# Patient Record
Sex: Male | Born: 1995 | Marital: Single | Smoking: Never smoker
Health system: Southern US, Community
[De-identification: ages and names within clinical notes are randomized; demographics above are authoritative.]

## PROBLEM LIST (undated history)

## (undated) HISTORY — PX: NO PAST SURGERIES: SHX2092

---
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
## 2019-07-20 ENCOUNTER — Ambulatory Visit (INDEPENDENT_AMBULATORY_CARE_PROVIDER_SITE_OTHER): Payer: Self-pay

## 2019-07-20 ENCOUNTER — Ambulatory Visit (HOSPITAL_COMMUNITY): Payer: Self-pay

## 2019-07-20 ENCOUNTER — Other Ambulatory Visit: Payer: Self-pay

## 2019-07-20 ENCOUNTER — Ambulatory Visit (HOSPITAL_COMMUNITY)
Admission: EM | Admit: 2019-07-20 | Discharge: 2019-07-20 | Disposition: A | Discharge status: Home / Self Care | Payer: Self-pay | Attending: Emergency Medicine | Admitting: Emergency Medicine

## 2019-07-20 ENCOUNTER — Encounter (HOSPITAL_COMMUNITY): Payer: Self-pay | Admitting: Emergency Medicine

## 2019-07-20 DIAGNOSIS — S6992XA Unspecified injury of left wrist, hand and finger(s), initial encounter: Secondary | ICD-10-CM

## 2019-07-20 DIAGNOSIS — S62617A Displaced fracture of proximal phalanx of left little finger, initial encounter for closed fracture: Secondary | ICD-10-CM

## 2019-07-20 DIAGNOSIS — M79645 Pain in left finger(s): Secondary | ICD-10-CM

## 2019-07-20 MED ORDER — NAPROXEN 500 MG PO TABS: 500.0000 mg | ORAL_TABLET | Freq: Two times a day (BID) | ORAL | 0 refills | Status: AC

## 2019-07-20 MED ORDER — IBUPROFEN 800 MG PO TABS
ORAL_TABLET | ORAL | Status: AC
  Filled 2019-07-20: qty 1

## 2019-07-20 MED ORDER — HYDROCODONE-ACETAMINOPHEN 5-325 MG PO TABS: 1.0000 | ORAL_TABLET | Freq: Three times a day (TID) | ORAL | 0 refills | Status: DC | PRN

## 2019-07-20 MED ORDER — HYDROCODONE-ACETAMINOPHEN 5-325 MG PO TABS: 2.0000 | ORAL_TABLET | ORAL | 0 refills | Status: DC | PRN

## 2019-07-20 MED ORDER — IBUPROFEN 800 MG PO TABS
800.0000 mg | ORAL_TABLET | Freq: Once | ORAL | Status: AC
  Administered 2019-07-20: 800 mg via ORAL

## 2019-07-20 MED ORDER — NAPROXEN 500 MG PO TABS: 500.0000 mg | ORAL_TABLET | Freq: Two times a day (BID) | ORAL | 0 refills | Status: DC

## 2019-07-20 NOTE — ED Notes (Signed)
Provided ice pack
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
## 2019-07-20 NOTE — Discharge Instructions (Addendum)
Please report to the [facility] emergency room now has your finger fracture is very severe and will likely need a surgical consult.
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
## 2019-07-21 ENCOUNTER — Emergency Department (HOSPITAL_COMMUNITY): Payer: Self-pay

## 2019-07-21 ENCOUNTER — Emergency Department (HOSPITAL_COMMUNITY)
Admission: EM | Admit: 2019-07-21 | Discharge: 2019-07-21 | Disposition: A | Discharge status: Home / Self Care | Payer: Self-pay | Attending: Emergency Medicine | Admitting: Emergency Medicine

## 2019-07-21 DIAGNOSIS — Y999 Unspecified external cause status: Secondary | ICD-10-CM | POA: Insufficient documentation

## 2019-07-21 DIAGNOSIS — Y939 Activity, unspecified: Secondary | ICD-10-CM | POA: Insufficient documentation

## 2019-07-21 DIAGNOSIS — S62617A Displaced fracture of proximal phalanx of left little finger, initial encounter for closed fracture: Secondary | ICD-10-CM | POA: Insufficient documentation

## 2019-07-21 DIAGNOSIS — Y929 Unspecified place or not applicable: Secondary | ICD-10-CM | POA: Insufficient documentation

## 2019-07-21 MED ORDER — IBUPROFEN 400 MG PO TABS
600.0000 mg | ORAL_TABLET | Freq: Once | ORAL | Status: AC
  Administered 2019-07-21: 600 mg via ORAL
  Filled 2019-07-21: qty 1

## 2019-07-21 MED ORDER — BUPIVACAINE HCL (PF) 0.5 % IJ SOLN
10.0000 mL | Freq: Once | INTRAMUSCULAR | Status: AC
  Administered 2019-07-21: 14:00:00 10 mL
  Filled 2019-07-21: qty 10

## 2019-07-21 NOTE — Progress Notes (Signed)
Called regarding left small finger fracture.  The patient was apparently seen in urgent care yesterday, attempts were made to contact the hand surgeon on-call, Dr. Lenon Curt, but never received a call back.  The urgent care referred the patient to the emergency room, but the patient left and never actually went to the emergency room.  He has come back today to [facility] emergency room.  I reviewed his x-rays which demonstrate a significantly displaced proximal phalanx fracture of the small finger, he apparently has a closed injury based on the emergency room evaluation.  I recommended digital block, closed reduction by the emergency room physician, buddy taping, splinting, and referral to Dr. Lenon Curt who has a hand subspecialty practice and was on call for the weekend for definitive management.  I am available to continue participating and contributing to his care if the emergency room closed reduction is not satisfactory, and will review the films when available.   Johnny Bridge, MD
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
## 2019-07-21 NOTE — Discharge Instructions (Signed)
There is evidence of a fracture on the x-ray. Pain:  Antiinflammatory medications: Take 600 mg of ibuprofen every 6 hours or 440 mg (over the counter dose) to 500 mg (prescription dose) of naproxen every 12 hours for the next 3 days. After this time, these medications may be used as needed for pain. Take these medications with food to avoid upset stomach. Choose only one of these medications, do not take them together. Acetaminophen (generic for Tylenol): Should you continue to have additional pain while taking the ibuprofen or naproxen, you may add in acetaminophen as needed. Your daily total maximum amount of acetaminophen from all sources should be limited to 4000mg /day for persons without liver problems, or 2000mg /day for those with liver problems. Ice: May apply ice to the injured area for no more than 15 minutes at a time to reduce swelling and pain. Elevation: Keep the extremity elevated whenever possible to reduce swelling and pain. Splint: Keep the splint clean and dry.  Protect it from water during bathing.  If the splint gets wet, you will need to have it reapplied.  Do not leave a wet splint against the skin as this can cause skin breakdown.  Call the orthopedist office or come to the ED for splint replacement, if needed.  Follow-up: Follow-up with the orthopedic hand specialist for any further management of this issue.  Call the number provided to set up an appointment. Return: Return to the emergency department for severely increased pain, numbness, blanching of the skin, or any other major concerns.
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
## 2019-07-21 NOTE — ED Notes (Signed)
Paged Ortho  

## 2019-07-21 NOTE — ED Notes (Signed)
Provider at bedside

## 2019-07-26 ENCOUNTER — Other Ambulatory Visit: Payer: Self-pay

## 2019-07-26 ENCOUNTER — Encounter (HOSPITAL_BASED_OUTPATIENT_CLINIC_OR_DEPARTMENT_OTHER): Payer: Self-pay | Admitting: *Deleted

## 2019-07-26 ENCOUNTER — Other Ambulatory Visit (HOSPITAL_COMMUNITY)
Admission: RE | Admit: 2019-07-26 | Discharge: 2019-07-26 | Disposition: A | Discharge status: Home / Self Care | Payer: 59 | Source: Ambulatory Visit | Attending: Orthopedic Surgery | Admitting: Orthopedic Surgery

## 2019-07-26 ENCOUNTER — Other Ambulatory Visit: Payer: Self-pay | Admitting: Orthopedic Surgery

## 2019-07-26 DIAGNOSIS — Z01812 Encounter for preprocedural laboratory examination: Secondary | ICD-10-CM | POA: Diagnosis not present

## 2019-07-26 DIAGNOSIS — S62609A Fracture of unspecified phalanx of unspecified finger, initial encounter for closed fracture: Secondary | ICD-10-CM | POA: Diagnosis not present

## 2019-07-26 DIAGNOSIS — Z20828 Contact with and (suspected) exposure to other viral communicable diseases: Secondary | ICD-10-CM | POA: Diagnosis not present

## 2019-07-26 LAB — SARS CORONAVIRUS 2 (TAT 6-24 HRS): SARS Coronavirus 2: NEGATIVE

## 2019-07-29 ENCOUNTER — Ambulatory Visit (HOSPITAL_BASED_OUTPATIENT_CLINIC_OR_DEPARTMENT_OTHER)
Admission: RE | Admit: 2019-07-29 | Discharge: 2019-07-29 | Disposition: A | Discharge status: Home / Self Care | Payer: 59 | Attending: Orthopedic Surgery | Admitting: Orthopedic Surgery

## 2019-07-29 ENCOUNTER — Other Ambulatory Visit: Payer: Self-pay

## 2019-07-29 ENCOUNTER — Ambulatory Visit (HOSPITAL_BASED_OUTPATIENT_CLINIC_OR_DEPARTMENT_OTHER): Payer: 59 | Admitting: Certified Registered"

## 2019-07-29 ENCOUNTER — Encounter (HOSPITAL_BASED_OUTPATIENT_CLINIC_OR_DEPARTMENT_OTHER): Payer: Self-pay | Admitting: Certified Registered"

## 2019-07-29 ENCOUNTER — Ambulatory Visit (HOSPITAL_COMMUNITY): Payer: 59

## 2019-07-29 ENCOUNTER — Encounter (HOSPITAL_BASED_OUTPATIENT_CLINIC_OR_DEPARTMENT_OTHER)
Admission: RE | Disposition: A | Discharge status: Home / Self Care | Payer: Self-pay | Source: Home / Self Care | Attending: Orthopedic Surgery

## 2019-07-29 DIAGNOSIS — Z7982 Long term (current) use of aspirin: Secondary | ICD-10-CM | POA: Diagnosis not present

## 2019-07-29 DIAGNOSIS — Z419 Encounter for procedure for purposes other than remedying health state, unspecified: Secondary | ICD-10-CM

## 2019-07-29 DIAGNOSIS — Z791 Long term (current) use of non-steroidal anti-inflammatories (NSAID): Secondary | ICD-10-CM | POA: Diagnosis not present

## 2019-07-29 DIAGNOSIS — S62617A Displaced fracture of proximal phalanx of left little finger, initial encounter for closed fracture: Secondary | ICD-10-CM | POA: Diagnosis not present

## 2019-07-29 HISTORY — PX: OPEN REDUCTION INTERNAL FIXATION (ORIF) PROXIMAL PHALANX: SHX6235

## 2019-07-29 SURGERY — OPEN REDUCTION INTERNAL FIXATION (ORIF) PROXIMAL PHALANX
Anesthesia: Monitor Anesthesia Care | Site: Finger | Laterality: Left

## 2019-07-29 MED ORDER — DEXAMETHASONE SODIUM PHOSPHATE 10 MG/ML IJ SOLN
INTRAMUSCULAR | Status: AC
  Filled 2019-07-29: qty 2

## 2019-07-29 MED ORDER — OXYCODONE HCL 5 MG PO TABS: 5.0000 mg | ORAL_TABLET | Freq: Four times a day (QID) | ORAL | 0 refills | Status: AC | PRN

## 2019-07-29 MED ORDER — PHENYLEPHRINE 40 MCG/ML (10ML) SYRINGE FOR IV PUSH (FOR BLOOD PRESSURE SUPPORT)
PREFILLED_SYRINGE | INTRAVENOUS | Status: AC
  Filled 2019-07-29: qty 10

## 2019-07-29 MED ORDER — IBUPROFEN 200 MG PO TABS: 600.0000 mg | ORAL_TABLET | Freq: Four times a day (QID) | ORAL | 0 refills | Status: DC

## 2019-07-29 MED ORDER — BUPIVACAINE-EPINEPHRINE 0.5% -1:200000 IJ SOLN
INTRAMUSCULAR | Status: DC | PRN
  Administered 2019-07-29: 10 mL

## 2019-07-29 MED ORDER — CEFAZOLIN SODIUM-DEXTROSE 2-4 GM/100ML-% IV SOLN
INTRAVENOUS | Status: AC
  Filled 2019-07-29: qty 100

## 2019-07-29 MED ORDER — CEFAZOLIN SODIUM-DEXTROSE 2-4 GM/100ML-% IV SOLN
2.0000 g | INTRAVENOUS | Status: AC
  Administered 2019-07-29: 08:00:00 2 g via INTRAVENOUS

## 2019-07-29 MED ORDER — LIDOCAINE 2% (20 MG/ML) 5 ML SYRINGE
INTRAMUSCULAR | Status: AC
  Filled 2019-07-29: qty 15

## 2019-07-29 MED ORDER — FENTANYL CITRATE (PF) 100 MCG/2ML IJ SOLN
50.0000 ug | INTRAMUSCULAR | Status: DC | PRN
  Administered 2019-07-29: 100 ug via INTRAVENOUS

## 2019-07-29 MED ORDER — OXYCODONE HCL 5 MG PO TABS: 5.0000 mg | ORAL_TABLET | Freq: Four times a day (QID) | ORAL | 0 refills | Status: DC | PRN

## 2019-07-29 MED ORDER — ONDANSETRON HCL 4 MG/2ML IJ SOLN: 4.0000 mg | Freq: Once | INTRAMUSCULAR | Status: DC | PRN

## 2019-07-29 MED ORDER — OXYCODONE HCL 5 MG/5ML PO SOLN: 5.0000 mg | Freq: Once | ORAL | Status: DC | PRN

## 2019-07-29 MED ORDER — LIDOCAINE HCL 2 % IJ SOLN
INTRAMUSCULAR | Status: DC | PRN
  Administered 2019-07-29: 10 mL

## 2019-07-29 MED ORDER — POVIDONE-IODINE 10 % EX SWAB: 2.0000 "application " | Freq: Once | CUTANEOUS | Status: DC

## 2019-07-29 MED ORDER — ACETAMINOPHEN 325 MG PO TABS: 650.0000 mg | ORAL_TABLET | Freq: Four times a day (QID) | ORAL | Status: DC

## 2019-07-29 MED ORDER — ONDANSETRON HCL 4 MG/2ML IJ SOLN
INTRAMUSCULAR | Status: AC
  Filled 2019-07-29: qty 12

## 2019-07-29 MED ORDER — LACTATED RINGERS IV SOLN
INTRAVENOUS | Status: DC
  Administered 2019-07-29 (×2): via INTRAVENOUS

## 2019-07-29 MED ORDER — ACETAMINOPHEN 325 MG PO TABS: 650.0000 mg | ORAL_TABLET | Freq: Four times a day (QID) | ORAL | Status: AC

## 2019-07-29 MED ORDER — MIDAZOLAM HCL 2 MG/2ML IJ SOLN
1.0000 mg | INTRAMUSCULAR | Status: DC | PRN
  Administered 2019-07-29: 08:00:00 2 mg via INTRAVENOUS

## 2019-07-29 MED ORDER — OXYCODONE HCL 5 MG PO TABS: 5.0000 mg | ORAL_TABLET | Freq: Once | ORAL | Status: DC | PRN

## 2019-07-29 MED ORDER — PROPOFOL 500 MG/50ML IV EMUL
INTRAVENOUS | Status: AC
  Filled 2019-07-29: qty 150

## 2019-07-29 MED ORDER — FENTANYL CITRATE (PF) 100 MCG/2ML IJ SOLN: 25.0000 ug | INTRAMUSCULAR | Status: DC | PRN

## 2019-07-29 MED ORDER — ONDANSETRON HCL 4 MG/2ML IJ SOLN
INTRAMUSCULAR | Status: DC | PRN
  Administered 2019-07-29: 4 mg via INTRAVENOUS

## 2019-07-29 MED ORDER — PROPOFOL 500 MG/50ML IV EMUL
INTRAVENOUS | Status: DC | PRN
  Administered 2019-07-29: 75 ug/kg/min via INTRAVENOUS

## 2019-07-29 MED ORDER — MIDAZOLAM HCL 2 MG/2ML IJ SOLN
INTRAMUSCULAR | Status: AC
  Filled 2019-07-29: qty 2

## 2019-07-29 MED ORDER — FENTANYL CITRATE (PF) 100 MCG/2ML IJ SOLN
INTRAMUSCULAR | Status: AC
  Filled 2019-07-29: qty 2

## 2019-07-29 MED ORDER — LIDOCAINE HCL (CARDIAC) PF 100 MG/5ML IV SOSY
PREFILLED_SYRINGE | INTRAVENOUS | Status: DC | PRN
  Administered 2019-07-29: 30 mg via INTRAVENOUS

## 2019-07-29 SURGICAL SUPPLY — 60 items
BAND RUBBER #18 3X1/16 STRL (MISCELLANEOUS) ×3 IMPLANT
BIT DRILL 1.1 (BIT) ×1
BIT DRILL 1.1MM (BIT) ×1
BIT DRILL 60X20X1.1XQC TMX (BIT) IMPLANT
BIT DRL 60X20X1.1XQC TMX (BIT) ×1
BLADE MINI RND TIP GREEN BEAV (BLADE) IMPLANT
BLADE SURG 15 STRL LF DISP TIS (BLADE) ×1 IMPLANT
BLADE SURG 15 STRL SS (BLADE) ×2
BNDG COHESIVE 2X5 TAN STRL LF (GAUZE/BANDAGES/DRESSINGS) IMPLANT
BNDG COHESIVE 4X5 TAN STRL (GAUZE/BANDAGES/DRESSINGS) ×3 IMPLANT
BNDG ESMARK 4X9 LF (GAUZE/BANDAGES/DRESSINGS) ×2 IMPLANT
BNDG GAUZE ELAST 4 BULKY (GAUZE/BANDAGES/DRESSINGS) ×3 IMPLANT
CAP PIN PROTECTOR ORTHO WHT (CAP) IMPLANT
CHLORAPREP W/TINT 26 (MISCELLANEOUS) ×3 IMPLANT
CORD BIPOLAR FORCEPS 12FT (ELECTRODE) ×3 IMPLANT
COVER BACK TABLE REUSABLE LG (DRAPES) ×3 IMPLANT
COVER MAYO STAND REUSABLE (DRAPES) ×3 IMPLANT
COVER WAND RF STERILE (DRAPES) IMPLANT
CUFF TOURN SGL QUICK 18X4 (TOURNIQUET CUFF) ×2 IMPLANT
DRAPE C-ARM 42X72 X-RAY (DRAPES) ×3 IMPLANT
DRAPE EXTREMITY T 121X128X90 (DISPOSABLE) ×3 IMPLANT
DRAPE SURG 17X23 STRL (DRAPES) ×3 IMPLANT
DRSG EMULSION OIL 3X3 NADH (GAUZE/BANDAGES/DRESSINGS) ×3 IMPLANT
GAUZE SPONGE 4X4 12PLY STRL LF (GAUZE/BANDAGES/DRESSINGS) ×3 IMPLANT
GLOVE BIO SURGEON STRL SZ7.5 (GLOVE) ×3 IMPLANT
GLOVE BIOGEL PI IND STRL 7.0 (GLOVE) ×1 IMPLANT
GLOVE BIOGEL PI IND STRL 8 (GLOVE) ×1 IMPLANT
GLOVE BIOGEL PI INDICATOR 7.0 (GLOVE) ×6
GLOVE BIOGEL PI INDICATOR 8 (GLOVE) ×2
GLOVE ECLIPSE 6.5 STRL STRAW (GLOVE) ×5 IMPLANT
GOWN STRL REUS W/ TWL LRG LVL3 (GOWN DISPOSABLE) ×2 IMPLANT
GOWN STRL REUS W/TWL LRG LVL3 (GOWN DISPOSABLE) ×4
GOWN STRL REUS W/TWL XL LVL3 (GOWN DISPOSABLE) ×3 IMPLANT
K-WIRE .045X4 (WIRE) IMPLANT
K-WIRE 9  SMOOTH .045 (WIRE) IMPLANT
NDL HYPO 25X1 1.5 SAFETY (NEEDLE) IMPLANT
NEEDLE HYPO 25X1 1.5 SAFETY (NEEDLE) ×3 IMPLANT
NS IRRIG 1000ML POUR BTL (IV SOLUTION) ×3 IMPLANT
PACK BASIN DAY SURGERY FS (CUSTOM PROCEDURE TRAY) ×3 IMPLANT
PADDING CAST ABS 4INX4YD NS (CAST SUPPLIES) ×2
PADDING CAST ABS COTTON 4X4 ST (CAST SUPPLIES) IMPLANT
PLATE T SMALL 1.5MM (Plate) ×2 IMPLANT
SCREW L 1.5X14 (Screw) ×2 IMPLANT
SCREW LOCKING 1.5X10 (Screw) ×4 IMPLANT
SCREW LOCKING 1.5X13MM (Screw) ×4 IMPLANT
SCREW LOCKING 1.5X8 (Screw) ×4 IMPLANT
SPLINT PLASTER CAST XFAST 3X15 (CAST SUPPLIES) IMPLANT
SPLINT PLASTER XTRA FASTSET 3X (CAST SUPPLIES)
STOCKINETTE 6  STRL (DRAPES) ×2
STOCKINETTE 6 STRL (DRAPES) ×1 IMPLANT
SUCTION FRAZIER HANDLE 10FR (MISCELLANEOUS)
SUCTION TUBE FRAZIER 10FR DISP (MISCELLANEOUS) IMPLANT
SUT VICRYL RAPIDE 4-0 (SUTURE) ×3 IMPLANT
SUT VICRYL RAPIDE 4/0 PS 2 (SUTURE) IMPLANT
SYR 10ML LL (SYRINGE) ×2 IMPLANT
SYR BULB 3OZ (MISCELLANEOUS) ×2 IMPLANT
TOWEL GREEN STERILE FF (TOWEL DISPOSABLE) ×3 IMPLANT
TUBE CONNECTING 20'X1/4 (TUBING)
TUBE CONNECTING 20X1/4 (TUBING) IMPLANT
UNDERPAD 30X36 HEAVY ABSORB (UNDERPADS AND DIAPERS) ×3 IMPLANT

## 2019-07-29 NOTE — Op Note (Signed)
07/29/2019  7:38 AM  PATIENT:  Thomas Underwood  23 y.o. male  PRE-OPERATIVE DIAGNOSIS:  L SF P1 fx  POST-OPERATIVE DIAGNOSIS:  Same  PROCEDURE:  ORIF L SF P1 fx  SURGEON: Rayvon Char. Grandville Silos, MD  PHYSICIAN ASSISTANT: Morley Kos, OPA-C  ANESTHESIA:  local and MAC  SPECIMENS:  None  DRAINS:   None  EBL:  less than 50 mL  PREOPERATIVE INDICATIONS:  Thomas Underwood is a  23 y.o. male with displaced angulated left small finger P1 fracture  The risks benefits and alternatives were discussed with the patient preoperatively including but not limited to the risks of infection, bleeding, nerve injury, cardiopulmonary complications, the need for revision surgery, among others, and the patient verbalized understanding and consented to proceed.  OPERATIVE IMPLANTS: Biomet ALPS handset, small T plate and screws  OPERATIVE PROCEDURE:  After receiving prophylactic antibiotics, the patient was escorted to the operative theatre and placed in a supine position.  A surgical "time-out" was performed during which the planned procedure, proposed operative site, and the correct patient identity were compared to the operative consent and agreement confirmed by the circulating nurse according to current facility policy.  Following application of a tourniquet to the operative extremity, I performed a digital block with a mixture of lidocaine Marcaine Baring epinephrine, and the exposed skin was prepped with Chloraprep and draped in the usual sterile fashion.  The limb was exsanguinated with an Esmarch bandage and the tourniquet inflated to approximately 132mHg higher than systolic BP.  Dorsal incision over the fracture phalanx was made sharply with a scalpel, reflecting full-thickness flaps.  The extensor apparatus was split down the midline and reflected.  The fracture was identified, and cleaned of clot debris and reduced.  A small T plate was applied to the dorsal surface, with alignment being  near-anatomic, and with clinically good composite flexion alignment.  Final images were obtained.  The wound was irrigated and the periosteum reapproximated as well as possible with 3-0 Vicryl Rapide suture over the plate, followed by running 4-0 Vicryl closure of the extensor apparatus.  The tourniquet was deflated, additional hemostasis obtained and the wound again irrigated.  The skin was then reapproximated with running 4-0 Vicryl Rapide horizontal mattress sutures.  A short arm splint dressing was applied and he was taken to the recovery room in stable condition.  DISPOSITION: He will be discharged home today, with [facility] a custom protective splint fabricated, and initiation of range of motion exercises.  He will follow-up in my office in 10 to 15 days with new x-rays of the affected digit out of splint.

## 2019-07-29 NOTE — Anesthesia Procedure Notes (Signed)
Procedure Name: MAC Date/Time: 07/29/2019 8:40 AM Performed by: Signe Colt, CRNA Pre-anesthesia Checklist: Patient identified, Emergency Drugs available, Suction available, Patient being monitored and Timeout performed Patient Re-evaluated:Patient Re-evaluated prior to induction Oxygen Delivery Method: Simple face mask

## 2019-07-29 NOTE — Transfer of Care (Signed)
Immediate Anesthesia Transfer of Care Note  Patient: Thomas Underwood  Procedure(s) Performed: OPEN REDUCTION INTERNAL FIXATION (ORIF) LEFT SMALL FINGER FRACTURE (Left Finger)  Patient Location: PACU  Anesthesia Type:MAC  Level of Consciousness: awake, alert , oriented and patient cooperative  Airway & Oxygen Therapy: Patient Spontanous Breathing  Post-op Assessment: Report given to RN and Post -op Vital signs reviewed and stable  Post vital signs: Reviewed and stable  Last Vitals:  Vitals Value Taken Time  BP    Temp    Pulse    Resp    SpO2      Last Pain:  Vitals:   07/29/19 0720  TempSrc: Oral  PainSc: 0-No pain      Patients Stated Pain Goal: 4 (09/09/14 9458)  Complications: No apparent anesthesia complications
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
## 2019-07-29 NOTE — Anesthesia Postprocedure Evaluation (Signed)
Anesthesia Post Note  Patient: Thomas Underwood  Procedure(s) Performed: OPEN REDUCTION INTERNAL FIXATION (ORIF) LEFT SMALL FINGER FRACTURE (Left Finger)     Patient location during evaluation: PACU Anesthesia Type: MAC Level of consciousness: awake and alert Pain management: pain level controlled Vital Signs Assessment: post-procedure vital signs reviewed and stable Respiratory status: spontaneous breathing, nonlabored ventilation and respiratory function stable Cardiovascular status: blood pressure returned to baseline and stable Postop Assessment: no apparent nausea or vomiting Anesthetic complications: no    Last Vitals:  Vitals:   07/29/19 0945 07/29/19 0955  BP: 112/75 107/70  Pulse: (!) 48 (!) 50  Resp: 11 16  Temp:  36.6 C  SpO2: 100% 100%    Last Pain:  Vitals:   07/29/19 0955  TempSrc:   PainSc: 0-No pain                 Lidia Collum

## 2019-07-29 NOTE — Anesthesia Preprocedure Evaluation (Signed)
Anesthesia Evaluation  Patient identified by MRN, date of birth, ID band Patient awake    Reviewed: Allergy & Precautions, NPO status , Patient's Chart, lab work & pertinent test results  History of Anesthesia Complications Negative for: history of anesthetic complications  Airway Mallampati: II  TM Distance: >3 FB Neck ROM: Full    Dental  (+) Teeth Intact   Pulmonary neg pulmonary ROS,    Pulmonary exam normal        Cardiovascular negative cardio ROS Normal cardiovascular exam     Neuro/Psych negative neurological ROS  negative psych ROS   GI/Hepatic negative GI ROS, Neg liver ROS,   Endo/Other  negative endocrine ROS  Renal/GU negative Renal ROS  negative genitourinary   Musculoskeletal negative musculoskeletal ROS (+)   Abdominal   Peds  Hematology negative hematology ROS (+)   Anesthesia Other Findings   Reproductive/Obstetrics                             Anesthesia Physical Anesthesia Plan  ASA: I  Anesthesia Plan: MAC   Post-op Pain Management:    Induction: Intravenous  PONV Risk Score and Plan: Propofol infusion, TIVA and Treatment may vary due to age or medical condition  Airway Management Planned: Natural Airway, Nasal Cannula and Simple Face Mask  Additional Equipment: None  Intra-op Plan:   Post-operative Plan:   Informed Consent: I have reviewed the patients History and Physical, chart, labs and discussed the procedure including the risks, benefits and alternatives for the proposed anesthesia with the patient or authorized representative who has indicated his/her understanding and acceptance.       Plan Discussed with:   Anesthesia Plan Comments:         Anesthesia Quick Evaluation

## 2019-07-29 NOTE — Discharge Instructions (Signed)
Discharge Instructions   You have a dressing with a plaster splint incorporated in it. Move your fingers as much as possible, making a full fist and fully opening the fist. Elevate your hand to reduce pain & swelling of the digits.  Ice over the operative site may be helpful to reduce pain & swelling.  DO NOT USE HEAT. Pain medicine has been prescribed for you.  Take Tylenol 650 mg every 6 hours. Take Naproxen as prescribed. Take Oxycodone 5 mg additionally for severe breakthrough pain. Leave the dressing in place until you return to our office.  You may shower, but keep the bandage clean & dry.  You may drive a car when you are off of prescription pain medications and can safely control your vehicle with both hands. Our office will call you to arrange follow-up   Please call [phone] during normal business hours or [phone] after hours for any problems. Including the following:  - excessive redness of the incisions - drainage for more than 4 days - fever of more than 101.5 F  *Please note that pain medications will not be refilled after hours or on weekends.  WORK STATUS: May return to work on Thursday 08/01/2019. No work with the left hand.   Post Anesthesia Home Care Instructions  Activity: Get plenty of rest for the remainder of the day. A responsible individual must stay with you for 24 hours following the procedure.  For the next 24 hours, DO NOT: -Drive a car -Paediatric nurse -Drink alcoholic beverages -Take any medication unless instructed by your physician -Make any legal decisions or sign important papers.  Meals: Start with liquid foods such as gelatin or soup. Progress to regular foods as tolerated. Avoid greasy, spicy, heavy foods. If nausea and/or vomiting occur, drink only clear liquids until the nausea and/or vomiting subsides. Call your physician if vomiting continues.  Special Instructions/Symptoms: Your throat may feel dry or sore from the  anesthesia or the breathing tube placed in your throat during surgery. If this causes discomfort, gargle with warm salt water. The discomfort should disappear within 24 hours.  If you had a scopolamine patch placed behind your ear for the management of post- operative nausea and/or vomiting:  1. The medication in the patch is effective for 72 hours, after which it should be removed.  Wrap patch in a tissue and discard in the trash. Wash hands thoroughly with soap and water. 2. You may remove the patch earlier than 72 hours if you experience unpleasant side effects which may include dry mouth, dizziness or visual disturbances. 3. Avoid touching the patch. Wash your hands with soap and water after contact with the patch.

## 2019-07-30 ENCOUNTER — Encounter (HOSPITAL_BASED_OUTPATIENT_CLINIC_OR_DEPARTMENT_OTHER): Payer: Self-pay | Admitting: Orthopedic Surgery

## 2019-08-07 ENCOUNTER — Ambulatory Visit: Payer: 59 | Attending: Orthopedic Surgery | Admitting: Occupational Therapy

## 2019-08-20 ENCOUNTER — Ambulatory Visit: Payer: 59 | Attending: Orthopedic Surgery | Admitting: *Deleted

## 2019-08-20 ENCOUNTER — Other Ambulatory Visit: Payer: Self-pay

## 2019-08-20 ENCOUNTER — Encounter: Payer: Self-pay | Admitting: *Deleted

## 2019-08-20 DIAGNOSIS — M6281 Muscle weakness (generalized): Secondary | ICD-10-CM | POA: Diagnosis present

## 2019-08-20 DIAGNOSIS — R600 Localized edema: Secondary | ICD-10-CM | POA: Insufficient documentation

## 2019-08-20 DIAGNOSIS — R278 Other lack of coordination: Secondary | ICD-10-CM | POA: Diagnosis not present

## 2019-08-20 DIAGNOSIS — M79642 Pain in left hand: Secondary | ICD-10-CM | POA: Insufficient documentation

## 2019-08-20 DIAGNOSIS — M25542 Pain in joints of left hand: Secondary | ICD-10-CM | POA: Diagnosis present

## 2019-08-20 DIAGNOSIS — M25642 Stiffness of left hand, not elsewhere classified: Secondary | ICD-10-CM | POA: Insufficient documentation

## 2019-08-20 DIAGNOSIS — M2560 Stiffness of unspecified joint, not elsewhere classified: Secondary | ICD-10-CM

## 2019-08-20 NOTE — Patient Instructions (Addendum)
Flexor Tendon Gliding (Active Full Fist)    Straighten all fingers, then make a fist, bending all joints. Repeat _10__ times. Do _6-8_ sessions per day.  AROM: Finger Flexion / Extension    Actively bend fingers of left hand. Start with knuckles furthest from palm, and slowly make a fist. Hold _3-5 seconds. Relax. Then straighten fingers as far as possible. Repeat _5-10___ times per set. Do __6-8__ sessions per day.    Do finger straightening at edge of table and hold for 3-5 seconds each time for stretch. Repeat 5-10 times 6-8 times per day. Or place hand on table and lift fingers to straighten, hold in extended position.  Edema Control - keep hand elevated and use ice for swelling as discussed in clinic.  Splinting use, care, precautions & wear were reviewed extensively in clinic today. Pt will wash splint PRN with cool water and soap or rubbing alcohol. He was able to don/doff in clinic independently and was also able to demonstrate performance of HEP for L small finger as observed in clinic.
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
## 2019-09-09 ENCOUNTER — Other Ambulatory Visit: Payer: Self-pay

## 2019-09-09 ENCOUNTER — Ambulatory Visit: Payer: 59 | Attending: Orthopedic Surgery | Admitting: Occupational Therapy

## 2019-09-09 DIAGNOSIS — M25542 Pain in joints of left hand: Secondary | ICD-10-CM | POA: Diagnosis not present

## 2019-09-09 DIAGNOSIS — M25642 Stiffness of left hand, not elsewhere classified: Secondary | ICD-10-CM | POA: Diagnosis present

## 2019-09-09 DIAGNOSIS — M6281 Muscle weakness (generalized): Secondary | ICD-10-CM

## 2019-09-09 DIAGNOSIS — M2560 Stiffness of unspecified joint, not elsewhere classified: Secondary | ICD-10-CM | POA: Diagnosis present

## 2019-09-09 NOTE — Patient Instructions (Signed)
  DO BELOW FIRST 4 EXERCISES WITH BUDDY STRAP ON:  Flexor Tendon Gliding (Active Hook Fist)   With fingers and knuckles straight, bend middle and tip joints. Do not bend large knuckles. Repeat _10-15___ times. Do 6-8___ sessions per day.  MP Flexion (Active)   With back of hand on table, bend large knuckles as far as they will go, keeping small joints straight. Repeat _10-15___ times. Do __6-8__ sessions per day. Activity: Reach into a narrow container.*      Finger Flexion / Extension   With palm up, bend fingers of left hand toward palm, making a  fist. Straighten fingers, opening fist. Repeat sequence _10-15___ times per session. Do _6-8__ sessions per day. Hand Variation: Palm down   PIP Extension (Active Controlled With Wrist and MP Flexion)    Using other hand to hold BIG KNUCKLES at _70-90___ flexion, straighten middle joints of fingers. Repeat _10-15___ times. Do _6-8___ sessions per day.     PIP / DIP Composite Flexion (Passive Stretch)    Use other hand to bend middle and tip joints of __small____ finger, keeping big knuckle straight. Hold __10__ seconds. Repeat __5__ times. Do __6-8__ sessions per day.    PIP Extension (Passive    Use thumb of other hand on top of joint and two fingers under- neath on either side to straighten middle joint of ___SMALL___ finger. Hold __10__ seconds. Repeat _5___ times. Do _6-8___ sessions per day.

## 2019-09-09 NOTE — Therapy (Signed)
[facility] [address] [place], [place], [address] Phone: [phone]   Fax:  [phone]  Occupational Therapy Treatment  Patient Details  Name: Thomas Underwood MRN: [id number] Date of Birth: 1996-08-31 Referring Provider (OT): Dr Milly Jakob   Encounter Date: 09/09/2019  OT End of Session - 09/09/19 1408    Visit Number  2    Number of Visits  12    Date for OT Re-Evaluation  10/01/19    Authorization Type  Pt reports having [employer] insurance through his step-mother, he did not have with him today - see front office notes as pt stated he will bring or fax.    OT Start Time  1318    OT Stop Time  1405    OT Time Calculation (min)  47 min    Activity Tolerance  Patient tolerated treatment well;No increased pain    Behavior During Therapy  [facility] for tasks assessed/performed       No past medical history on file.  Past Surgical History:  Procedure Laterality Date  . NO PAST SURGERIES    . OPEN REDUCTION INTERNAL FIXATION (ORIF) PROXIMAL PHALANX Left 07/29/2019   Procedure: OPEN REDUCTION INTERNAL FIXATION (ORIF) LEFT SMALL FINGER FRACTURE;  Surgeon: Milly Jakob, MD;  Location: [facility];  Service: Orthopedics;  Laterality: Left;  FINISH OFF WITH LOCAL    There were no vitals filed for this visit.  Subjective Assessment - 09/09/19 1322    Pertinent History  ORIF Lt small finger proximal phalanx 07/29/19    Limitations  no strengthening    Patient Stated Goals  Pt wants to get use of left hand hand and small finger for daily activity    Currently in Pain?  Yes    Pain Score  7     Pain Location  --   small finger   Pain Orientation  Left    Pain Descriptors / Indicators  Aching    Pain Type  Acute pain    Pain Onset  More than a month ago    Pain Frequency  Intermittent    Aggravating Factors   with P/ROM    Pain Relieving Factors  Rest       Pt arrived with splint on. When splint  removed, pt demo decreased ability to actively extend PIP joint of Lt small finger even though therapist could passively achieve almost full extension. Pt also demo decreased active and passive flexion at PIP joint. Lt small finger: MP flex = 70*, ext = 0*;  PIP flex = 70*, ext = -55* Updated HEP and issued buddy straps for first 4 exercises - see pt instructions for details. Pt demo each x 5 reps. Pt instructed in how to properly don buddy straps for greatest effectiveness.  Adjusted splint for full PIP extension and reviewed wear and care.   Pt does not currently have f/u appointment with hand surgeon. Pt encouraged to make an appointment ASAP                     OT Education - 09/09/19 1345    Education Details  Updated HEP    Person(s) Educated  Patient    Methods  Explanation;Demonstration;Verbal cues;Handout    Comprehension  Verbalized understanding;Returned demonstration;Verbal cues required       OT Short Term Goals - 09/09/19 1409      OT SHORT TERM GOAL #1   Title  Pt will be I splint  use, care and precautions L hand/small finger.    Baseline  verbal cues required    Time  3    Period  Weeks    Status  Achieved    Target Date  09/10/19      OT SHORT TERM GOAL #2   Title  Pt will be Mod I HEP L small finger    Baseline  Req Min VC's    Time  3    Period  Weeks    Status  Achieved    Target Date  09/10/19        OT Long Term Goals - 08/20/19 1049      OT LONG TERM GOAL #1   Title  Pt wil lbe Mod I upgraded HEP L small finger    Baseline  dependent    Time  6    Period  Weeks    Status  New    Target Date  10/01/19      OT LONG TERM GOAL #2   Title  Pt wil lbe Mod I edema control techniques L hand.small finger    Baseline  Req Min VC's    Time  6    Period  Weeks    Status  New    Target Date  10/01/19      OT LONG TERM GOAL #3   Title  Pt will demonstrate increased functional use L hand as seen by ability to make functional full fist  during simulated ADL activity.    Baseline  dependent/unable    Time  6    Period  Weeks    Status  New    Target Date  10/01/19      OT LONG TERM GOAL #4   Title  Pt will be Mod I scar management techniques L dorsal small finger.    Baseline  Dependent    Time  6    Period  Weeks    Status  New    Target Date  10/01/19      OT LONG TERM GOAL #5   Title  Pt will demonstrate increased active finger extension at PIP as seen by improved PIPJ extension of -20* or better L small finger.    Baseline  L small PIP A/ROM = -58-70* at initial eval on 08/20/19.    Time  6    Period  Weeks    Status  New    Target Date  10/01/19            Plan - 09/09/19 1409    Clinical Impression Statement  Pt has met STG's. Pt returns today after initial evaluation. Pt demo Lt small finger PIP extensor lag, however not due to stiffness as normally seen, but appears more to be some nerve damage. Pt also with decreased PIP flexion both actively and passively.    Occupational performance deficits (Please refer to evaluation for details):  ADL's;IADL's;Leisure;Work    Rehab Potential  Good    OT Frequency  2x / week    OT Duration  8 weeks    OT Treatment/Interventions  Self-care/ADL training;Fluidtherapy;Splinting;Therapeutic activities;Therapeutic exercise;Ultrasound;Scar mobilization;Passive range of motion;Paraffin;Manual Therapy;Patient/family education    Plan  review HEP, begin light strengthening    Consulted and Agree with Plan of Care  Patient       Patient will benefit from skilled therapeutic intervention in order to improve the following deficits and impairments:           Visit Diagnosis:  Pain in joint of left hand  Stiffness in joint  Stiffness of left hand, not elsewhere classified  Muscle weakness (generalized)    Problem List There are no active problems to display for this patient.   Carey Bullocks, OTR/L 09/09/2019, 2:12 PM  [facility] [address] [place], [place], [address] Phone: [phone]   Fax:  [phone]  Name: Emet Rafanan MRN: [id number] Date of Birth: Dec 12, 1995

## 2019-09-11 ENCOUNTER — Ambulatory Visit: Payer: 59 | Admitting: Occupational Therapy

## 2019-09-16 ENCOUNTER — Ambulatory Visit: Payer: 59 | Admitting: Occupational Therapy

## 2019-09-16 ENCOUNTER — Other Ambulatory Visit: Payer: Self-pay

## 2019-09-16 DIAGNOSIS — M25542 Pain in joints of left hand: Secondary | ICD-10-CM | POA: Diagnosis not present

## 2019-09-16 DIAGNOSIS — M6281 Muscle weakness (generalized): Secondary | ICD-10-CM

## 2019-09-16 NOTE — Therapy (Signed)
[facility] [address] [place], [place], [address] Phone: [phone]   Fax:  [phone]  Occupational Therapy Treatment  Patient Details  Name: Thomas Underwood MRN: [id number] Date of Birth: Sep 06, 1996 Referring Provider (OT): Dr Milly Jakob   Encounter Date: 09/16/2019  OT End of Session - 09/16/19 1344    Visit Number  3    Number of Visits  12    Date for OT Re-Evaluation  10/01/19    Authorization Type  Pt reports having [employer] insurance through his step-mother, he did not have with him today - see front office notes as pt stated he will bring or fax.    OT Start Time  1310    OT Stop Time  1350    OT Time Calculation (min)  40 min    Activity Tolerance  Patient tolerated treatment well;No increased pain    Behavior During Therapy  [facility] for tasks assessed/performed       No past medical history on file.  Past Surgical History:  Procedure Laterality Date  . NO PAST SURGERIES    . OPEN REDUCTION INTERNAL FIXATION (ORIF) PROXIMAL PHALANX Left 07/29/2019   Procedure: OPEN REDUCTION INTERNAL FIXATION (ORIF) LEFT SMALL FINGER FRACTURE;  Surgeon: Milly Jakob, MD;  Location: [facility];  Service: Orthopedics;  Laterality: Left;  FINISH OFF WITH LOCAL    There were no vitals filed for this visit.  Subjective Assessment - 09/16/19 1313    Subjective   I have an appointment with Dr. Grandville Silos tomorrow    Pertinent History  ORIF Lt small finger proximal phalanx 07/29/19    Limitations  begin light strengthening per protocol 4-6 weeks post op (pt now 7 weeks)    Patient Stated Goals  Pt wants to get use of left hand hand and small finger for daily activity    Currently in Pain?  Yes    Pain Score  7     Pain Location  --   small finger   Pain Orientation  Left    Pain Descriptors / Indicators  Aching    Pain Type  Acute pain    Pain Onset  More than a month ago    Pain Frequency  Intermittent     Aggravating Factors   passive flexion of finger    Pain Relieving Factors  rest       Note sent to MD via patient re: updated ROM Lt small finger, grip strength, and question re: continued need for splinting or ok to be d/c. Pt may benefit from continued splint wear at night only to help w/ PIP extensor lag.   Issued putty HEP and red putty - see pt instructions for details. Pt performed 1st ex x 20 reps, and 2nd and 3rd ex's x 10 reps as indicated.  Reviewed previously issued HEP  Fluidotherapy x 12 min. Lt hand to decrease stiffness/pain  Lt small PIP flex = 75*, ext = -40*. Lt grip strength = 60 lbs (Rt = 90 lbs)                     OT Education - 09/16/19 1339    Education Details  Putty HEP    Person(s) Educated  Patient    Methods  Explanation;Demonstration;Handout    Comprehension  Verbalized understanding;Returned demonstration       OT Short Term Goals - 09/09/19 1409      OT SHORT TERM GOAL #1  Title  Pt will be I splint use, care and precautions L hand/small finger.    Baseline  verbal cues required    Time  3    Period  Weeks    Status  Achieved    Target Date  09/10/19      OT SHORT TERM GOAL #2   Title  Pt will be Mod I HEP L small finger    Baseline  Req Min VC's    Time  3    Period  Weeks    Status  Achieved    Target Date  09/10/19        OT Long Term Goals - 09/16/19 1352      OT LONG TERM GOAL #1   Title  Pt wil lbe Mod I upgraded HEP L small finger    Baseline  dependent    Time  6    Period  Weeks    Status  On-going      OT LONG TERM GOAL #2   Title  Pt wil lbe Mod I edema control techniques L hand.small finger    Baseline  Req Min VC's    Time  6    Period  Weeks    Status  Deferred      OT LONG TERM GOAL #3   Title  Pt will demonstrate increased functional use L hand as seen by ability to make functional full fist during simulated ADL activity.    Baseline  dependent/unable    Time  6    Period  Weeks     Status  On-going      OT LONG TERM GOAL #4   Title  Pt will be Mod I scar management techniques L dorsal small finger.    Baseline  Dependent    Time  6    Period  Weeks    Status  New      OT LONG TERM GOAL #5   Title  Pt will demonstrate increased active finger extension at PIP as seen by improved PIPJ extension of -20* or better L small finger.    Baseline  L small PIP A/ROM = -58-70* at initial eval on 08/20/19.    Time  6    Period  Weeks    Status  On-going   -40-75*           Plan - 09/16/19 1356    Clinical Impression Statement  Pt making progress towards LTG's. Pt has also shown improved ROM at small finger PIP joint since last week.    Occupational performance deficits (Please refer to evaluation for details):  ADL's;IADL's;Leisure;Work    Rehab Potential  Good    OT Frequency  2x / week    OT Duration  8 weeks    OT Treatment/Interventions  Self-care/ADL training;Fluidtherapy;Splinting;Therapeutic activities;Therapeutic exercise;Ultrasound;Scar mobilization;Passive range of motion;Paraffin;Manual Therapy;Patient/family education    Plan  continue fluidotherapy, A/ROM, P/ROM, strengthening, consider short intense PIP flex splinting w/ PJ elastic    Consulted and Agree with Plan of Care  Patient       Patient will benefit from skilled therapeutic intervention in order to improve the following deficits and impairments:           Visit Diagnosis: Pain in joint of left hand  Muscle weakness (generalized)    Problem List There are no active problems to display for this patient.   Kelli ChurnBallie, Saahir Prude Johnson, OTR/L 09/16/2019, 1:59 PM  [facility] [phone]  [facility] [address] [place], [place], [address] Phone: [phone]   Fax:  [phone]  Name: Xaviar Lunn MRN: [id number] Date of Birth: Jul 21, 1996

## 2019-09-16 NOTE — Patient Instructions (Signed)
   1. Grip Strengthening (Resistive Putty)   Squeeze putty using thumb and all fingers. Repeat _20___ times. Do __2__ sessions per day.    2. IP Fisting (Resistive Putty)    Keeping big knuckles straight, bend fingertips only to squeeze putty. Repeat __10__ times. Do _2___ sessions per day.   3. MP Flexion (Resistive Putty)    Bending only at large knuckles, press putty down against thumb. Keep fingertips straight. Repeat _10___ times. Do __2__ sessions per day.  Copyright  VHI. All rights reserved.

## 2019-09-18 ENCOUNTER — Ambulatory Visit: Payer: 59 | Admitting: Occupational Therapy

## 2019-09-18 ENCOUNTER — Other Ambulatory Visit: Payer: Self-pay

## 2019-09-18 DIAGNOSIS — M25542 Pain in joints of left hand: Secondary | ICD-10-CM | POA: Diagnosis not present

## 2019-09-18 DIAGNOSIS — M2560 Stiffness of unspecified joint, not elsewhere classified: Secondary | ICD-10-CM

## 2019-09-18 DIAGNOSIS — M25642 Stiffness of left hand, not elsewhere classified: Secondary | ICD-10-CM

## 2019-09-18 DIAGNOSIS — M6281 Muscle weakness (generalized): Secondary | ICD-10-CM

## 2019-09-18 NOTE — Therapy (Signed)
[facility] [address] [place], [place], [address] Phone: [phone]   Fax:  [phone]  Occupational Therapy Treatment  Patient Details  Name: Thomas Underwood MRN: [id number] Date of Birth: August 13, 1996 Referring Provider (OT): Dr Mack Hook   Encounter Date: 09/18/2019  OT End of Session - 09/18/19 1334    Visit Number  4    Number of Visits  12    Date for OT Re-Evaluation  10/01/19    Authorization Type  Pt reports having [employer] insurance through his step-mother, he did not have with him today - see front office notes as pt stated he will bring or fax.    OT Start Time  1320    OT Stop Time  1405    OT Time Calculation (min)  45 min    Activity Tolerance  Patient tolerated treatment well    Behavior During Therapy  W[facility] for tasks assessed/performed       No past medical history on file.  Past Surgical History:  Procedure Laterality Date  . NO PAST SURGERIES    . OPEN REDUCTION INTERNAL FIXATION (ORIF) PROXIMAL PHALANX Left 07/29/2019   Procedure: OPEN REDUCTION INTERNAL FIXATION (ORIF) LEFT SMALL FINGER FRACTURE;  Surgeon: Mack Hook, MD;  Location: [facility];  Service: Orthopedics;  Laterality: Left;  FINISH OFF WITH LOCAL    There were no vitals filed for this visit.  Subjective Assessment - 09/18/19 1324    Subjective   I forgot to bring the paper but Dr. Janee Morn said I could d/c splint    Pertinent History  ORIF Lt small finger proximal phalanx 07/29/19    Limitations  begin light strengthening per protocol 4-6 weeks post op (pt now 7 weeks)    Patient Stated Goals  Pt wants to get use of left hand hand and small finger for daily activity    Currently in Pain?  No/denies        Fluidotherapy x 10 min. To decrease stiffness and pain. Pen rolling ex using high lighter for greater isolated PIP flexion, composite flexion, followed by intrinsic + position. Pt instructed this ex  can replace 1 session of previously issued A/ROM ex's.  Reviewed putty ex's to ensure correct positioning - pt return demo x 5 reps of each Made IP flexion elastic splint for short duration flexion stretch - pt instructed to wear no more than 5 minutes at one time, but can wear up to 6 times per day.  Made instructed he can d/c elastic splint however if he feels no benefit at PIP joint and only at DIP joint.                      OT Short Term Goals - 09/09/19 1409      OT SHORT TERM GOAL #1   Title  Pt will be I splint use, care and precautions L hand/small finger.    Baseline  verbal cues required    Time  3    Period  Weeks    Status  Achieved    Target Date  09/10/19      OT SHORT TERM GOAL #2   Title  Pt will be Mod I HEP L small finger    Baseline  Req Min VC's    Time  3    Period  Weeks    Status  Achieved    Target Date  09/10/19  OT Long Term Goals - 09/18/19 1342      OT LONG TERM GOAL #1   Title  Pt wil lbe Mod I upgraded HEP L small finger    Baseline  dependent    Time  6    Period  Weeks    Status  Achieved      OT LONG TERM GOAL #2   Title  Pt wil lbe Mod I edema control techniques L hand.small finger    Baseline  Req Min VC's    Time  6    Period  Weeks    Status  Deferred      OT LONG TERM GOAL #3   Title  Pt will demonstrate increased functional use L hand as seen by ability to make functional full fist during simulated ADL activity.    Baseline  dependent/unable    Time  6    Period  Weeks    Status  On-going      OT LONG TERM GOAL #4   Title  Pt will be Mod I scar management techniques L dorsal small finger.    Baseline  Dependent    Time  6    Period  Weeks    Status  Deferred   Skin gliding nicely     OT LONG TERM GOAL #5   Title  Pt will demonstrate increased active finger extension at PIP as seen by improved PIPJ extension of -20* or better L small finger.    Baseline  L small PIP A/ROM = -58-70* at initial  eval on 08/20/19.    Time  6    Period  Weeks    Status  On-going   -40-75*           Plan - 09/18/19 1343    Clinical Impression Statement  Pt continues to make progress with hand function and small finger motion    Occupational performance deficits (Please refer to evaluation for details):  ADL's;IADL's;Leisure;Work    Rehab Potential  Good    OT Frequency  2x / week    OT Duration  8 weeks    OT Treatment/Interventions  Self-care/ADL training;Fluidtherapy;Splinting;Therapeutic activities;Therapeutic exercise;Ultrasound;Scar mobilization;Passive range of motion;Paraffin;Manual Therapy;Patient/family education    Plan  make better fitted pm splint for PIP extensor lag (T shaped) - to wear only at night    Consulted and Agree with Plan of Care  Patient       Patient will benefit from skilled therapeutic intervention in order to improve the following deficits and impairments:           Visit Diagnosis: Muscle weakness (generalized)  Stiffness in joint  Stiffness of left hand, not elsewhere classified  Pain in joint of left hand    Problem List There are no active problems to display for this patient.   Carey Bullocks, OTR/L 09/18/2019, 2:05 PM  [facility] [address] [place], [place], [address] Phone: [phone]   Fax:  [phone]  Name: Jennifer Holland MRN: [id number] Date of Birth: May 22, 1996

## 2019-09-23 ENCOUNTER — Other Ambulatory Visit: Payer: Self-pay

## 2019-09-23 ENCOUNTER — Ambulatory Visit: Payer: 59 | Admitting: Occupational Therapy

## 2019-09-23 DIAGNOSIS — M25642 Stiffness of left hand, not elsewhere classified: Secondary | ICD-10-CM

## 2019-09-23 DIAGNOSIS — M25542 Pain in joints of left hand: Secondary | ICD-10-CM | POA: Diagnosis not present

## 2019-09-23 DIAGNOSIS — M2560 Stiffness of unspecified joint, not elsewhere classified: Secondary | ICD-10-CM

## 2019-09-23 NOTE — Therapy (Signed)
[facility] [address] [place], [place], [address] Phone: [phone]   Fax:  [phone]  Occupational Therapy Treatment  Patient Details  Name: Thomas Underwood MRN: [id number] Date of Birth: 26-Jul-1996 Referring Provider (OT): Dr Milly Jakob   Encounter Date: 09/23/2019  OT End of Session - 09/23/19 1346    Visit Number  5    Number of Visits  12    Date for OT Re-Evaluation  10/01/19    Authorization Type  Pt reports having [employer] insurance through his step-mother, he did not have with him today - see front office notes as pt stated he will bring or fax.    OT Start Time  1320    OT Stop Time  1400    OT Time Calculation (min)  40 min    Activity Tolerance  Patient tolerated treatment well    Behavior During Therapy  WFL for tasks assessed/performed       No past medical history on file.  Past Surgical History:  Procedure Laterality Date  . NO PAST SURGERIES    . OPEN REDUCTION INTERNAL FIXATION (ORIF) PROXIMAL PHALANX Left 07/29/2019   Procedure: OPEN REDUCTION INTERNAL FIXATION (ORIF) LEFT SMALL FINGER FRACTURE;  Surgeon: Milly Jakob, MD;  Location: [facility];  Service: Orthopedics;  Laterality: Left;  FINISH OFF WITH LOCAL    There were no vitals filed for this visit.  Subjective Assessment - 09/23/19 1324    Subjective   The splint feels fine (re: pm splint)    Pertinent History  ORIF Lt small finger proximal phalanx 07/29/19    Limitations  begin light strengthening per protocol 4-6 weeks post op (pt now 7 weeks)    Patient Stated Goals  Pt wants to get use of left hand hand and small finger for daily activity    Currently in Pain?  No/denies                   OT Treatments/Exercises (OP) - 09/23/19 0001      Hand Exercises   Other Hand Exercises  A/ROM in IP flexion blocking MP in extension, composite flexion, and reverse blocking for PIP extension       Modalities   Modalities  Fluidotherapy      LUE Fluidotherapy   Number Minutes Fluidotherapy  10 Minutes    LUE Fluidotherapy Location  Hand    Comments  to decrease stiffness      Splinting   Splinting  Fabricated and fitted pm extension splint to keep PIP joint in extension. Issued splint and reviewed wear and care               OT Short Term Goals - 09/09/19 1409      OT SHORT TERM GOAL #1   Title  Pt will be I splint use, care and precautions L hand/small finger.    Baseline  verbal cues required    Time  3    Period  Weeks    Status  Achieved    Target Date  09/10/19      OT SHORT TERM GOAL #2   Title  Pt will be Mod I HEP L small finger    Baseline  Req Min VC's    Time  3    Period  Weeks    Status  Achieved    Target Date  09/10/19        OT Long Term Goals - 09/18/19 1342  OT LONG TERM GOAL #1   Title  Pt wil lbe Mod I upgraded HEP L small finger    Baseline  dependent    Time  6    Period  Weeks    Status  Achieved      OT LONG TERM GOAL #2   Title  Pt wil lbe Mod I edema control techniques L hand.small finger    Baseline  Req Min VC's    Time  6    Period  Weeks    Status  Deferred      OT LONG TERM GOAL #3   Title  Pt will demonstrate increased functional use L hand as seen by ability to make functional full fist during simulated ADL activity.    Baseline  dependent/unable    Time  6    Period  Weeks    Status  On-going      OT LONG TERM GOAL #4   Title  Pt will be Mod I scar management techniques L dorsal small finger.    Baseline  Dependent    Time  6    Period  Weeks    Status  Deferred   Skin gliding nicely     OT LONG TERM GOAL #5   Title  Pt will demonstrate increased active finger extension at PIP as seen by improved PIPJ extension of -20* or better L small finger.    Baseline  L small PIP A/ROM = -58-70* at initial eval on 08/20/19.    Time  6    Period  Weeks    Status  On-going   -40-75*           Plan -  09/23/19 1347    Clinical Impression Statement  Pt continues to make progress with hand function and small finger motion. Pt reports exercises are helping    Occupational performance deficits (Please refer to evaluation for details):  ADL's;IADL's;Leisure;Work    Rehab Potential  Good    OT Frequency  2x / week    OT Duration  8 weeks    OT Treatment/Interventions  Self-care/ADL training;Fluidtherapy;Splinting;Therapeutic activities;Therapeutic exercise;Ultrasound;Scar mobilization;Passive range of motion;Paraffin;Manual Therapy;Patient/family education    Plan  splint check/adjustments prn, continue ROM, gripper activity       Patient will benefit from skilled therapeutic intervention in order to improve the following deficits and impairments:           Visit Diagnosis: Stiffness in joint  Stiffness of left hand, not elsewhere classified    Problem List There are no active problems to display for this patient.   Kelli Churn, OTR/L 09/23/2019, 1:50 PM  [facility] [address] [place], [place], [address] Phone: [phone]   Fax:  [phone]  Name: Tayvian Holycross MRN: [id number] Date of Birth: 1996-08-23

## 2019-09-25 ENCOUNTER — Other Ambulatory Visit: Payer: Self-pay

## 2019-09-25 ENCOUNTER — Ambulatory Visit: Payer: 59 | Admitting: Occupational Therapy

## 2019-09-25 DIAGNOSIS — M25642 Stiffness of left hand, not elsewhere classified: Secondary | ICD-10-CM

## 2019-09-25 DIAGNOSIS — M25542 Pain in joints of left hand: Secondary | ICD-10-CM | POA: Diagnosis not present

## 2019-09-25 DIAGNOSIS — M6281 Muscle weakness (generalized): Secondary | ICD-10-CM

## 2019-09-25 NOTE — Therapy (Signed)
[facility] [address] [place], [place], [address] Phone: [phone]   Fax:  [phone]  Occupational Therapy Treatment  Patient Details  Name: Thomas Underwood MRN: [id number] Date of Birth: March 06, 1996 Referring Provider (OT): Dr Mack Hook   Encounter Date: 09/25/2019  OT End of Session - 09/25/19 1332    Visit Number  6    Number of Visits  12    Date for OT Re-Evaluation  10/01/19    Authorization Type  Pt reports having [employer] insurance through his step-mother, he did not have with him today - see front office notes as pt stated he will bring or fax.    OT Start Time  1315    OT Stop Time  1400    OT Time Calculation (min)  45 min    Activity Tolerance  Patient tolerated treatment well    Behavior During Therapy  WFL for tasks assessed/performed       No past medical history on file.  Past Surgical History:  Procedure Laterality Date  . NO PAST SURGERIES    . OPEN REDUCTION INTERNAL FIXATION (ORIF) PROXIMAL PHALANX Left 07/29/2019   Procedure: OPEN REDUCTION INTERNAL FIXATION (ORIF) LEFT SMALL FINGER FRACTURE;  Surgeon: Mack Hook, MD;  Location: [facility];  Service: Orthopedics;  Laterality: Left;  FINISH OFF WITH LOCAL    There were no vitals filed for this visit.  Subjective Assessment - 09/25/19 1329    Subjective   Pt reports pm splint feeling fine w/ no need for adjustments    Pertinent History  ORIF Lt small finger proximal phalanx 07/29/19    Limitations  begin light strengthening per protocol 4-6 weeks post op (pt now 7 weeks)    Patient Stated Goals  Pt wants to get use of left hand hand and small finger for daily activity    Currently in Pain?  No/denies   Pt only has pain w/ PROM full composite flexion at Lt small finger                  OT Treatments/Exercises (OP) - 09/25/19 0001      Hand Exercises   Other Hand Exercises  A/ROM in IP flexion  blocking MP in extension, composite flexion, and reverse blocking for PIP extension     Other Hand Exercises  Gripper set at level 2 resistance to pick up blocks Lt hand for sustained grip strength w/ min difficulty. AA/ROM to promote PIP flex and extension with card activity      LUE Fluidotherapy   Number Minutes Fluidotherapy  12 Minutes    LUE Fluidotherapy Location  Hand    Comments  at beginning of session to decrease stiffness       Pt issued green resistance putty for first ex only (composite flexion) from putty HEP. Pt instructed to continue using red resistance putty for remaining 2 intrinsic ex's.         OT Short Term Goals - 09/09/19 1409      OT SHORT TERM GOAL #1   Title  Pt will be I splint use, care and precautions L hand/small finger.    Baseline  verbal cues required    Time  3    Period  Weeks    Status  Achieved    Target Date  09/10/19      OT SHORT TERM GOAL #2   Title  Pt will be Mod I HEP L small finger  Baseline  Req Min VC's    Time  3    Period  Weeks    Status  Achieved    Target Date  09/10/19        OT Long Term Goals - 09/18/19 1342      OT LONG TERM GOAL #1   Title  Pt wil lbe Mod I upgraded HEP L small finger    Baseline  dependent    Time  6    Period  Weeks    Status  Achieved      OT LONG TERM GOAL #2   Title  Pt wil lbe Mod I edema control techniques L hand.small finger    Baseline  Req Min VC's    Time  6    Period  Weeks    Status  Deferred      OT LONG TERM GOAL #3   Title  Pt will demonstrate increased functional use L hand as seen by ability to make functional full fist during simulated ADL activity.    Baseline  dependent/unable    Time  6    Period  Weeks    Status  On-going      OT LONG TERM GOAL #4   Title  Pt will be Mod I scar management techniques L dorsal small finger.    Baseline  Dependent    Time  6    Period  Weeks    Status  Deferred   Skin gliding nicely     OT LONG TERM GOAL #5   Title   Pt will demonstrate increased active finger extension at PIP as seen by improved PIPJ extension of -20* or better L small finger.    Baseline  L small PIP A/ROM = -58-70* at initial eval on 08/20/19.    Time  6    Period  Weeks    Status  On-going   -40-75*           Plan - 09/25/19 1356    Clinical Impression Statement  Pt progressing with Lt hand grip strength and Lt small finger ROM    Occupational performance deficits (Please refer to evaluation for details):  ADL's;IADL's;Leisure;Work    Rehab Potential  Good    OT Frequency  2x / week    OT Duration  8 weeks    OT Treatment/Interventions  Self-care/ADL training;Fluidtherapy;Splinting;Therapeutic activities;Therapeutic exercise;Ultrasound;Scar mobilization;Passive range of motion;Paraffin;Manual Therapy;Patient/family education    Plan  Continue fluidotherapy, ROM, strength    Consulted and Agree with Plan of Care  Patient       Patient will benefit from skilled therapeutic intervention in order to improve the following deficits and impairments:           Visit Diagnosis: Stiffness of left hand, not elsewhere classified  Muscle weakness (generalized)  Pain in joint of left hand    Problem List There are no active problems to display for this patient.   Carey Bullocks, OTR/L 09/25/2019, 1:58 PM  [name] [address] [place], [place], [address] Phone: [phone]   Fax:  [phone]  Name: Thomas Underwood MRN: [id number] Date of Birth: [birth year]

## 2019-09-30 ENCOUNTER — Other Ambulatory Visit: Payer: Self-pay

## 2019-09-30 ENCOUNTER — Ambulatory Visit: Payer: 59 | Admitting: Occupational Therapy

## 2019-09-30 DIAGNOSIS — M6281 Muscle weakness (generalized): Secondary | ICD-10-CM

## 2019-09-30 DIAGNOSIS — M25642 Stiffness of left hand, not elsewhere classified: Secondary | ICD-10-CM

## 2019-09-30 DIAGNOSIS — M25542 Pain in joints of left hand: Secondary | ICD-10-CM | POA: Diagnosis not present

## 2019-09-30 NOTE — Therapy (Signed)
[facility] [address] [place], [place], [address] Phone: [phone]   Fax:  [phone]  Occupational Therapy Treatment  Patient Details  Name: Thomas Underwood MRN: [id number] Date of Birth: [birth year] Referring Provider (OT): Dr Mack Hook   Encounter Date: 09/30/2019  OT End of Session - 09/30/19 1413    Visit Number  7    Number of Visits  12    Date for OT Re-Evaluation  10/01/19    Authorization Type  Pt reports having [employer] insurance through his step-mother, he did not have with him today - see front office notes as pt stated he will bring or fax.    OT Start Time  1315    OT Stop Time  1400    OT Time Calculation (min)  45 min    Activity Tolerance  Patient tolerated treatment well    Behavior During Therapy  WFL for tasks assessed/performed       No past medical history on file.  Past Surgical History:  Procedure Laterality Date  . NO PAST SURGERIES    . OPEN REDUCTION INTERNAL FIXATION (ORIF) PROXIMAL PHALANX Left 07/29/2019   Procedure: OPEN REDUCTION INTERNAL FIXATION (ORIF) LEFT SMALL FINGER FRACTURE;  Surgeon: Mack Hook, MD;  Location:  [facility];  Service: Orthopedics;  Laterality: Left;  FINISH OFF WITH LOCAL    There were no vitals filed for this visit.  Subjective Assessment - 09/30/19 1328    Pertinent History  ORIF Lt small finger proximal phalanx 07/29/19    Limitations  begin light strengthening per protocol 4-6 weeks post op (pt now 7 weeks)    Patient Stated Goals  Pt wants to get use of left hand hand and small finger for daily activity    Currently in Pain?  No/denies                   OT Treatments/Exercises (OP) - 09/30/19 0001      Hand Exercises   Other Hand Exercises  Worked on composite finger flexion in A/ROM and P/ROM, followed by active PIP extension w/ MP's blocked in flexion      LUE Fluidotherapy   Number Minutes Fluidotherapy   12 Minutes    LUE Fluidotherapy Location  Hand    Comments  at beginning of session to decrease stiffness      Splinting   Splinting  Tried LMB type finger splint for PIP extension however did not fit well (trying multiple sizes). Issued Oval 8 for daytime wear, but instructed to take off to perform ex's and prevent stiffness.                OT Short Term Goals - 09/09/19 1409      OT SHORT TERM GOAL #1   Title  Pt will be I splint use, care and precautions L hand/small finger.    Baseline  verbal cues required    Time  3    Period  Weeks    Status  Achieved    Target Date  09/10/19      OT SHORT TERM GOAL #2   Title  Pt will be Mod I HEP L small finger    Baseline  Req Min VC's    Time  3    Period  Weeks    Status  Achieved    Target Date  09/10/19        OT Long Term Goals - 09/18/19 1342  OT LONG TERM GOAL #1   Title  Pt wil lbe Mod I upgraded HEP L small finger    Baseline  dependent    Time  6    Period  Weeks    Status  Achieved      OT LONG TERM GOAL #2   Title  Pt wil lbe Mod I edema control techniques L hand.small finger    Baseline  Req Min VC's    Time  6    Period  Weeks    Status  Deferred      OT LONG TERM GOAL #3   Title  Pt will demonstrate increased functional use L hand as seen by ability to make functional full fist during simulated ADL activity.    Baseline  dependent/unable    Time  6    Period  Weeks    Status  On-going      OT LONG TERM GOAL #4   Title  Pt will be Mod I scar management techniques L dorsal small finger.    Baseline  Dependent    Time  6    Period  Weeks    Status  Deferred   Skin gliding nicely     OT LONG TERM GOAL #5   Title  Pt will demonstrate increased active finger extension at PIP as seen by improved PIPJ extension of -20* or better L small finger.    Baseline  L small PIP A/ROM = -58-70* at initial eval on 08/20/19.    Time  6    Period  Weeks    Status  On-going   -40-75*            Plan - 09/30/19 1414    Clinical Impression Statement  Pt progressing with hand function. Pt still lacks active PIP extension    Occupational performance deficits (Please refer to evaluation for details):  ADL's;IADL's;Leisure;Work    Rehab Potential  Good    OT Frequency  2x / week    OT Duration  8 weeks    OT Treatment/Interventions  Self-care/ADL training;Fluidtherapy;Splinting;Therapeutic activities;Therapeutic exercise;Ultrasound;Scar mobilization;Passive range of motion;Paraffin;Manual Therapy;Patient/family education    Plan  Continue fluidotherapy, ROM, strength (gripper)    Consulted and Agree with Plan of Care  Patient       Patient will benefit from skilled therapeutic intervention in order to improve the following deficits and impairments:           Visit Diagnosis: Stiffness of left hand, not elsewhere classified  Muscle weakness (generalized)    Problem List There are no active problems to display for this patient.   Carey Bullocks, OTR/L 09/30/2019, 2:15 PM  [facility] [address] [place], [place], [address] Phone: [phone]   Fax:  [phone]  Name: Thomas Underwood MRN: [id number] Date of Birth: 1996/04/08

## 2019-10-02 ENCOUNTER — Ambulatory Visit: Payer: 59 | Admitting: Occupational Therapy

## 2019-10-02 ENCOUNTER — Other Ambulatory Visit: Payer: Self-pay

## 2019-10-02 DIAGNOSIS — M25542 Pain in joints of left hand: Secondary | ICD-10-CM | POA: Diagnosis not present

## 2019-10-02 DIAGNOSIS — M25642 Stiffness of left hand, not elsewhere classified: Secondary | ICD-10-CM

## 2019-10-02 DIAGNOSIS — M6281 Muscle weakness (generalized): Secondary | ICD-10-CM

## 2019-10-02 DIAGNOSIS — M2560 Stiffness of unspecified joint, not elsewhere classified: Secondary | ICD-10-CM

## 2019-10-02 NOTE — Therapy (Signed)
[facility] [address] [place], [place], [address] Phone: [phone]   Fax:  [phone]  Occupational Therapy Treatment  Patient Details  Name: Thomas Underwood MRN: [id number] Date of Birth: Jan 10, 1996 Referring Provider (OT): Dr Mack Hook   Encounter Date: 10/02/2019  OT End of Session - 10/02/19 1334    Visit Number  8    Number of Visits  12    Date for OT Re-Evaluation  10/01/19    Authorization Type  Pt reports having [employer] insurance through his step-mother, he did not have with him today - see front office notes as pt stated he will bring or fax.    OT Start Time  1318    OT Stop Time  1400    OT Time Calculation (min)  42 min    Activity Tolerance  Patient tolerated treatment well    Behavior During Therapy  WFL for tasks assessed/performed       No past medical history on file.  Past Surgical History:  Procedure Laterality Date  . NO PAST SURGERIES    . OPEN REDUCTION INTERNAL FIXATION (ORIF) PROXIMAL PHALANX Left 07/29/2019   Procedure: OPEN REDUCTION INTERNAL FIXATION (ORIF) LEFT SMALL FINGER FRACTURE;  Surgeon: Mack Hook, MD;  Location: [facility];  Service: Orthopedics;  Laterality: Left;  FINISH OFF WITH LOCAL    There were no vitals filed for this visit.  Subjective Assessment - 10/02/19 1332    Pertinent History  ORIF Lt small finger proximal phalanx 07/29/19    Limitations  begin light strengthening per protocol 4-6 weeks post op (pt now 7 weeks)    Patient Stated Goals  Pt wants to get use of left hand hand and small finger for daily activity    Currently in Pain?  No/denies                   OT Treatments/Exercises (OP) - 10/02/19 0001      Hand Exercises   Other Hand Exercises  Placing graded clothespins on antenna yellow to green resistance between 4th/5th digits and thumb to facilitate PIP ext and increase strength ulnar side. Pt then removing  clothespins (same resistances)    Other Hand Exercises  Gripper set at level 3 resistance to pick up blocks Lt hand for sustained grip strength w/ min to mod difficulty.       LUE Fluidotherapy   Number Minutes Fluidotherapy  12 Minutes    LUE Fluidotherapy Location  Hand    Comments  to decrease stiffness               OT Short Term Goals - 09/09/19 1409      OT SHORT TERM GOAL #1   Title  Pt will be I splint use, care and precautions L hand/small finger.    Baseline  verbal cues required    Time  3    Period  Weeks    Status  Achieved    Target Date  09/10/19      OT SHORT TERM GOAL #2   Title  Pt will be Mod I HEP L small finger    Baseline  Req Min VC's    Time  3    Period  Weeks    Status  Achieved    Target Date  09/10/19        OT Long Term Goals - 09/18/19 1342      OT LONG TERM GOAL #1  Title  Pt wil lbe Mod I upgraded HEP L small finger    Baseline  dependent    Time  6    Period  Weeks    Status  Achieved      OT LONG TERM GOAL #2   Title  Pt wil lbe Mod I edema control techniques L hand.small finger    Baseline  Req Min VC's    Time  6    Period  Weeks    Status  Deferred      OT LONG TERM GOAL #3   Title  Pt will demonstrate increased functional use L hand as seen by ability to make functional full fist during simulated ADL activity.    Baseline  dependent/unable    Time  6    Period  Weeks    Status  On-going      OT LONG TERM GOAL #4   Title  Pt will be Mod I scar management techniques L dorsal small finger.    Baseline  Dependent    Time  6    Period  Weeks    Status  Deferred   Skin gliding nicely     OT LONG TERM GOAL #5   Title  Pt will demonstrate increased active finger extension at PIP as seen by improved PIPJ extension of -20* or better L small finger.    Baseline  L small PIP A/ROM = -58-70* at initial eval on 08/20/19.    Time  6    Period  Weeks    Status  On-going   -40-75*           Plan - 10/02/19 1334     Clinical Impression Statement  Pt continues to improve in Lt hand function and strength    Occupational performance deficits (Please refer to evaluation for details):  ADL's;IADL's;Leisure;Work    Marketing executive / Function / Physical Skills  ROM;IADL;Edema;Scar mobility;Sensation;Strength;Coordination;FMC;Pain;UE functional use    Rehab Potential  Good    OT Frequency  2x / week    OT Duration  8 weeks    OT Treatment/Interventions  Self-care/ADL training;Fluidtherapy;Splinting;Therapeutic activities;Therapeutic exercise;Ultrasound;Scar mobilization;Passive range of motion;Paraffin;Manual Therapy;Patient/family education    Plan  Continue fluidotherapy, ROM, strength (gripper)    Consulted and Agree with Plan of Care  Patient       Patient will benefit from skilled therapeutic intervention in order to improve the following deficits and impairments:   Body Structure / Function / Physical Skills: ROM, IADL, Edema, Scar mobility, Sensation, Strength, Coordination, FMC, Pain, UE functional use       Visit Diagnosis: Stiffness of left hand, not elsewhere classified  Muscle weakness (generalized)  Stiffness in joint    Problem List There are no active problems to display for this patient.   Carey Bullocks, OTR/L 10/02/2019, 1:50 PM  [facility] [address] [place], [place], [address] Phone: [phone]   Fax:  [phone]  Name: Thomas Underwood MRN: [id number] Date of Birth: 1996/11/07

## 2019-10-22 ENCOUNTER — Ambulatory Visit: Payer: 59 | Attending: Orthopedic Surgery | Admitting: Occupational Therapy

## 2020-01-08 NOTE — Therapy (Signed)
[facility] [address] [place], [place], [address] Phone: [phone]   Fax:  [phone]  Patient Details  Name: Thomas Underwood MRN: [id number] Date of Birth: 10-26-1996 Referring Provider:  Dr. Grandville Silos Encounter Date: 01/08/2020  OCCUPATIONAL THERAPY DISCHARGE SUMMARY  Visits from Start of Care: 8  Current functional level related to goals / functional outcomes: OT Short Term Goals - 09/09/19 1409      OT SHORT TERM GOAL #1   Title  Pt will be I splint use, care and precautions L hand/small finger.    Baseline  verbal cues required    Time  3    Period  Weeks    Status  Achieved    Target Date  09/10/19      OT SHORT TERM GOAL #2   Title  Pt will be Mod I HEP L small finger    Baseline  Req Min VC's    Time  3    Period  Weeks    Status  Achieved    Target Date  09/10/19      OT Long Term Goals - 09/18/19 1342      OT LONG TERM GOAL #1   Title  Pt wil lbe Mod I upgraded HEP L small finger    Baseline  dependent    Time  6    Period  Weeks    Status  Achieved      OT LONG TERM GOAL #2   Title  Pt wil lbe Mod I edema control techniques L hand.small finger    Baseline  Req Min VC's    Time  6    Period  Weeks    Status  Deferred      OT LONG TERM GOAL #3   Title  Pt will demonstrate increased functional use L hand as seen by ability to make functional full fist during simulated ADL activity.    Baseline  dependent/unable    Time  6    Period  Weeks    Status  On-going      OT LONG TERM GOAL #4   Title  Pt will be Mod I scar management techniques L dorsal small finger.    Baseline  Dependent    Time  6    Period  Weeks    Status  Deferred   Skin gliding nicely     OT LONG TERM GOAL #5   Title  Pt will demonstrate increased active finger extension at PIP as seen by improved PIPJ extension of -20* or better L small finger.    Baseline  L small PIP A/ROM = -58-70* at initial eval on  08/20/19.    Time  6    Period  Weeks    Status  On-going   -40-75*        Remaining deficits: Unknown secondary to not returning after 10/02/19   Education / Equipment: Splint wear and care, HEP  Plan: Patient agrees to discharge.  Patient goals were partially met. Patient is being discharged due to not returning since the last visit.  ?????       Carey Bullocks, OTR/L 01/08/2020, 12:55 PM  [facility] [address] [place], [place], [address] Phone: [phone]   Fax:  [phone]

## 2020-06-26 IMAGING — RF DG FINGER LITTLE 2+V*L*
1 series · 3 of 3 positions shown · non-contrast
Comparison: Hand in finger radiographs 07/20/2019 and 07/21/2019.

CLINICAL DATA: ORIF of the little finger and the left hand.

EXAM:
DG C-ARM 1-60 MIN; LEFT LITTLE FINGER 2+V

[Series 1: run · 3 of 3 slices shown]
[im 1/3]
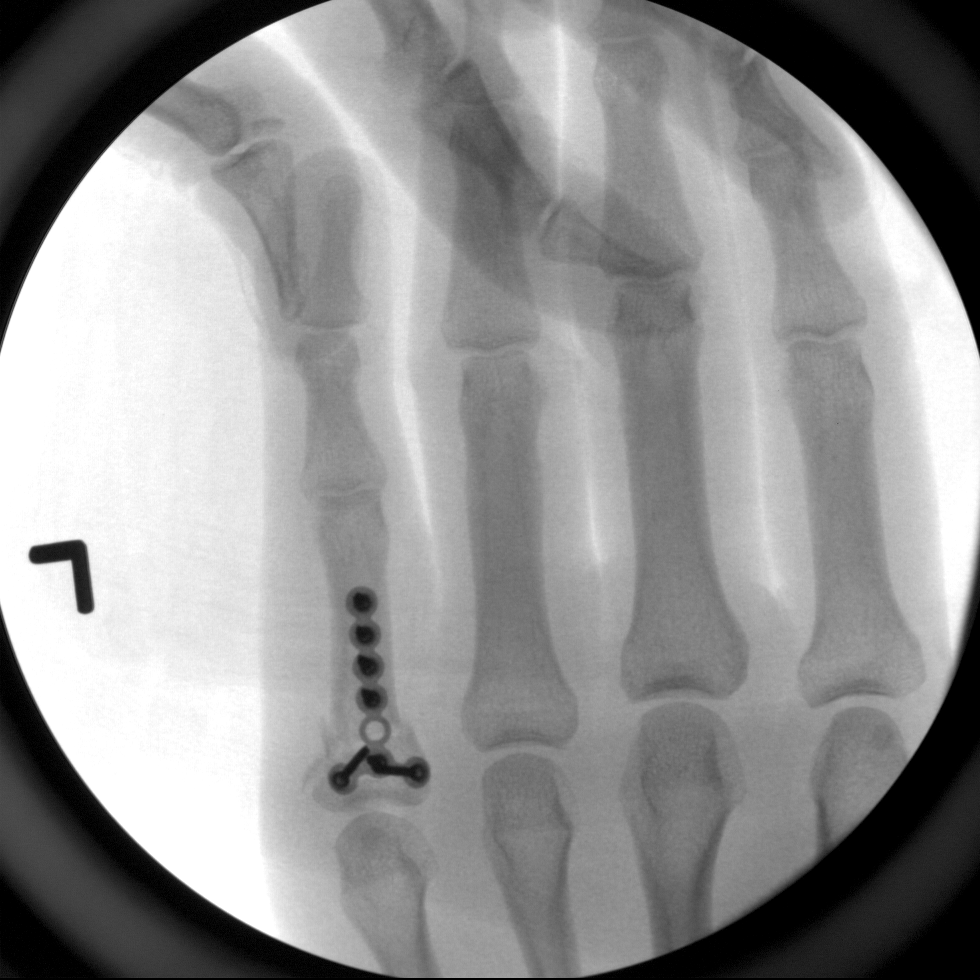
[im 2/3]
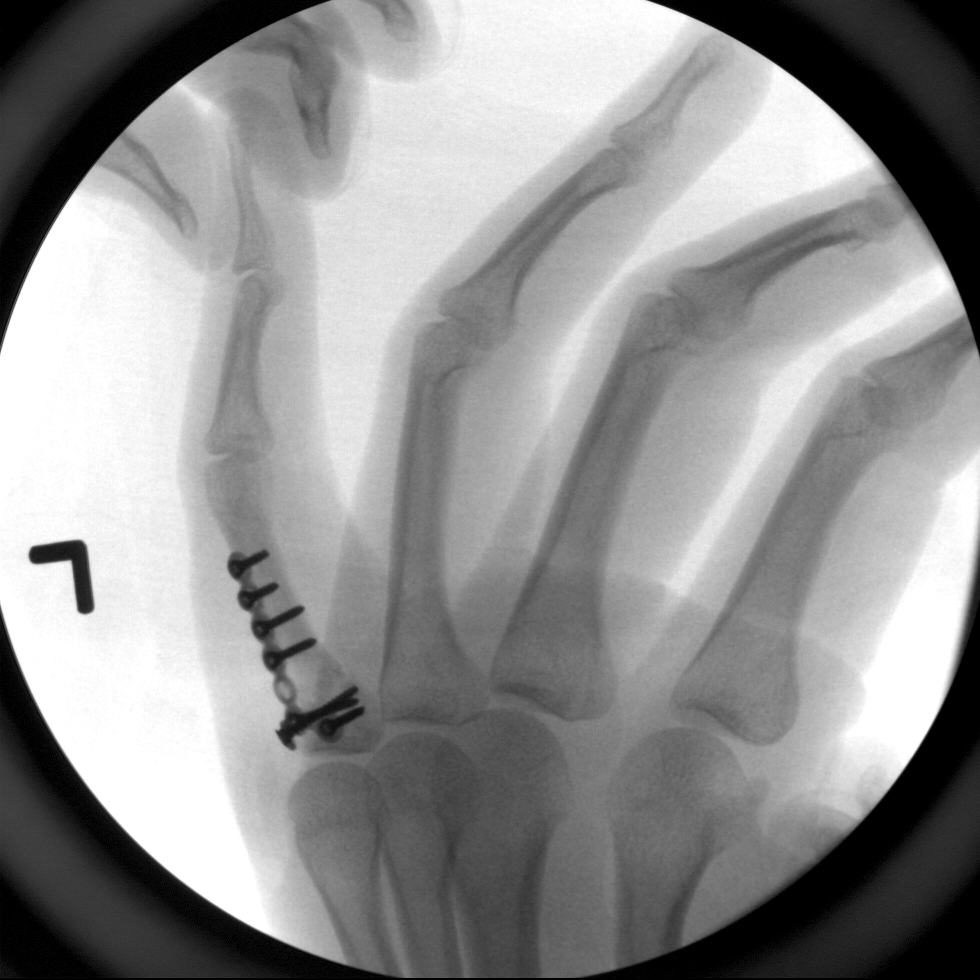
[im 3/3]
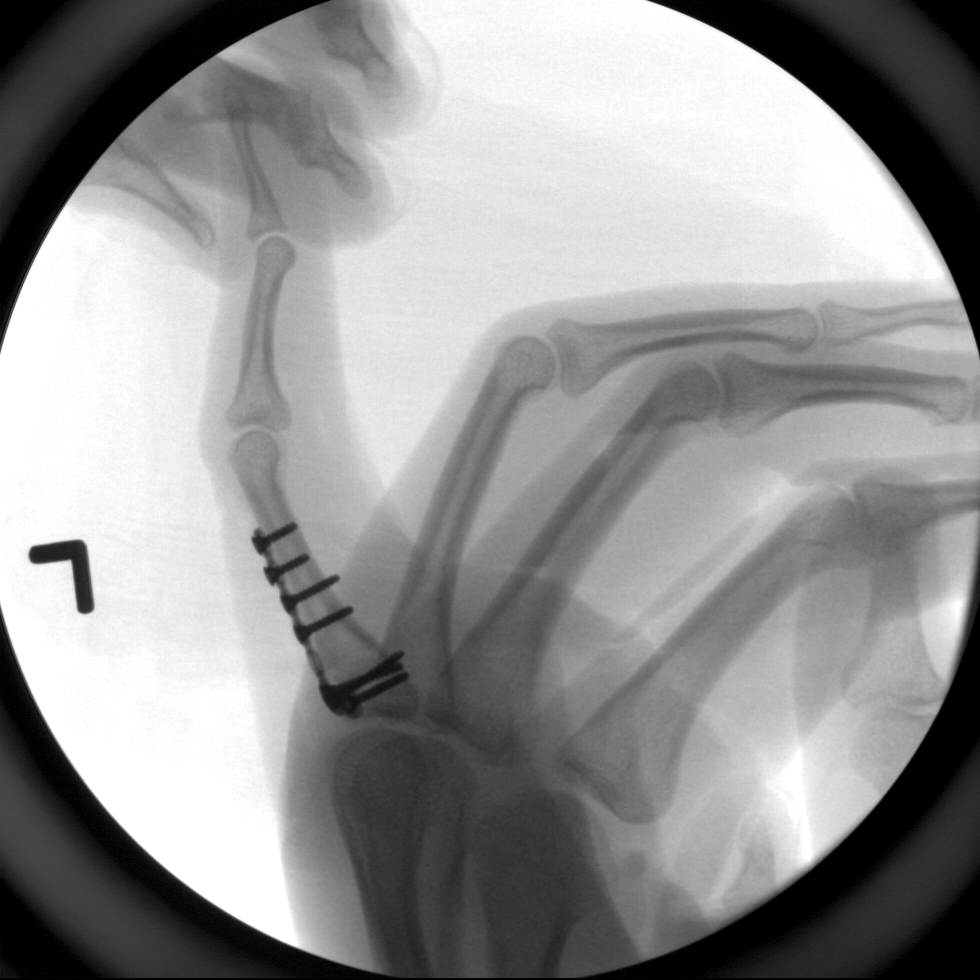

[3 of 3 positions shown; findings below may reference images not displayed]

FLUOROSCOPY TIME:  Fluoroscopy Time:  11 seconds

Number of Acquired Spot Images: 3
FINDINGS: Dorsal plate and screw fixation is present across a comminuted
fracture involving the proximal phalanx in the fifth digit. For
distal in 3 proximal screws are in place. Near anatomic reduction is
achieved.
IMPRESSION: 1. Near anatomic reduction of proximal phalanx fracture involving
the fifth digit.
2. No radiographic evidence for complication.
